# Patient Record
Sex: Female | Born: 1965 | Race: Black or African American | Hispanic: No | Marital: Single | Smoking: Current every day smoker
Health system: Southern US, Community
[De-identification: ages and names within clinical notes are randomized; demographics above are authoritative.]

## PROBLEM LIST (undated history)

## (undated) ENCOUNTER — Emergency Department (HOSPITAL_COMMUNITY): Payer: Medicaid Other

## (undated) DIAGNOSIS — M1990 Unspecified osteoarthritis, unspecified site: Secondary | ICD-10-CM

## (undated) DIAGNOSIS — N200 Calculus of kidney: Secondary | ICD-10-CM

---

## 2015-05-17 ENCOUNTER — Other Ambulatory Visit: Payer: Self-pay | Admitting: Internal Medicine

## 2015-05-17 DIAGNOSIS — E2839 Other primary ovarian failure: Secondary | ICD-10-CM

## 2015-05-28 ENCOUNTER — Other Ambulatory Visit: Payer: Self-pay | Admitting: Internal Medicine

## 2015-05-28 ENCOUNTER — Ambulatory Visit
Admission: RE | Admit: 2015-05-28 | Discharge: 2015-05-28 | Disposition: A | Discharge status: Home / Self Care | Payer: Medicaid Other | Source: Ambulatory Visit | Attending: Internal Medicine | Admitting: Internal Medicine

## 2015-05-28 DIAGNOSIS — E2839 Other primary ovarian failure: Secondary | ICD-10-CM

## 2015-05-28 DIAGNOSIS — Z1231 Encounter for screening mammogram for malignant neoplasm of breast: Secondary | ICD-10-CM

## 2015-06-08 ENCOUNTER — Encounter: Payer: Medicaid Other | Admitting: Family Medicine

## 2015-06-29 ENCOUNTER — Encounter: Payer: Self-pay | Admitting: Obstetrics & Gynecology

## 2015-07-18 ENCOUNTER — Encounter: Payer: Self-pay | Admitting: Obstetrics & Gynecology

## 2015-08-10 ENCOUNTER — Encounter: Payer: Self-pay | Admitting: Family Medicine

## 2016-10-30 ENCOUNTER — Ambulatory Visit (HOSPITAL_COMMUNITY)
Admission: EM | Admit: 2016-10-30 | Discharge: 2016-10-30 | Disposition: A | Discharge status: Home / Self Care | Payer: Medicaid Other | Attending: Emergency Medicine | Admitting: Emergency Medicine

## 2016-10-30 ENCOUNTER — Encounter (HOSPITAL_COMMUNITY): Payer: Self-pay | Admitting: Emergency Medicine

## 2016-10-30 ENCOUNTER — Ambulatory Visit (INDEPENDENT_AMBULATORY_CARE_PROVIDER_SITE_OTHER): Payer: Medicaid Other

## 2016-10-30 DIAGNOSIS — M1711 Unilateral primary osteoarthritis, right knee: Secondary | ICD-10-CM

## 2016-10-30 MED ORDER — TRIAMCINOLONE ACETONIDE 40 MG/ML IJ SUSP
INTRAMUSCULAR | Status: AC
  Filled 2016-10-30: qty 1

## 2016-10-30 MED ORDER — LIDOCAINE HCL (PF) 2 % IJ SOLN
INTRAMUSCULAR | Status: AC
  Filled 2016-10-30: qty 2

## 2016-10-30 NOTE — ED Triage Notes (Signed)
Here for right knee pain and swelling onset 1 week  States this occurred during the cold weather  Hx of right knee surgery... Denies hardware  Pain increases w/activity   A&O x4... NAD

## 2016-10-30 NOTE — Discharge Instructions (Signed)
You have some arthritis in your knee that was flared up by the cold. We did a cortisone injection today. Ice your knee tonight and tomorrow. Take ibuprofen or Tylenol as needed for pain. The cortisone takes 24-48 hours to kick in. Follow-up as needed.

## 2016-10-30 NOTE — ED Provider Notes (Signed)
[facility]    CSN: [id number] Arrival date & time: 10/30/16  1809     History   Chief Complaint Chief Complaint  Patient presents with  . Knee Pain    HPI Krista Romero is a 51 y.o. female.   HPI  She is a 51 year old woman here for evaluation of right knee pain and swelling. Symptoms started about a week ago when we had the cold snap. She states she had an anterior cruciate ligament repair in that knee when she was 17. She denies any problems with the knee until the last week or so. Pain is located primarily at the anterior and medial aspects of the knee. It is worse with weightbearing and going upstairs. She states it feels like it will slip out on her. No known injury or trauma.  History reviewed. No pertinent past medical history.  There are no active problems to display for this patient.   History reviewed. No pertinent surgical history.  OB History    No data available       Home Medications    Prior to Admission medications   Not on File    Family History No family history on file.  Social History Social History  Substance Use Topics  . Smoking status: Current Every Day Smoker    Packs/day: 0.50    Types: Cigarettes  . Smokeless tobacco: Never Used  . Alcohol use Yes     Allergies   Patient has no known allergies.   Review of Systems Review of Systems As in history of present illness  Physical Exam Triage Vital Signs ED Triage Vitals  Enc Vitals Group     BP 10/30/16 1918 143/80     Pulse Rate 10/30/16 1918 89     Resp 10/30/16 1918 16     Temp 10/30/16 1918 98.1 F (36.7 C)     Temp Source 10/30/16 1918 Oral     SpO2 10/30/16 1918 100 %     Weight --      Height --      Head Circumference --      Peak Flow --      Pain Score 10/30/16 1919 7     Pain Loc --      Pain Edu? --      Excl. in GC? --    No data found.   Updated Vital Signs BP 143/80 (BP Location: Left Arm)   Pulse 89   Temp 98.1 F (36.7 C) (Oral)    Resp 16   SpO2 100%   Visual Acuity Right Eye Distance:   Left Eye Distance:   Bilateral Distance:    Right Eye Near:   Left Eye Near:    Bilateral Near:     Physical Exam  Constitutional: She is oriented to person, place, and time. She appears well-developed and well-nourished. No distress.  Cardiovascular: Normal rate.   Pulmonary/Chest: Effort normal.  Musculoskeletal:  Right knee: No erythema. She does have some soft tissue swelling just medial to the patellar tendon. No appreciable joint effusion. No point tenderness. Negative McMurray's. No joint laxity.  Neurological: She is alert and oriented to person, place, and time.     UC Treatments / Results  Labs (all labs ordered are listed, but only abnormal results are displayed) Labs Reviewed - No data to display  EKG  EKG Interpretation None       Radiology Dg Knee Complete 4 Views Right  Result Date: 10/30/2016 CLINICAL DATA:  Pain swelling and difficulty bearing weight on the right knee. EXAM: RIGHT KNEE - COMPLETE 4+ VIEW COMPARISON:  None. FINDINGS: No evidence of fracture, or dislocation. There is a small to moderate suprapatellar joint effusion. Mild osteoarthritic changes of all 3 compartments of the knee noted. Intra-articular calcifications are seen within the lateral compartment of the knee joint. IMPRESSION: Small to moderate suprapatellar joint effusion. Mild 3 compartment osteoarthritic changes. Electronically Signed   By: Ted Mcalpineobrinka  Dimitrova M.D.   On: 10/30/2016 20:21    Procedures Injection of joint Date/Time: 10/30/2016 9:04 PM Performed by: Charm RingsHONIG, Deakin Lacek J Authorized by: Charm RingsHONIG, Ulani Degrasse J  Consent: Verbal consent obtained. Consent given by: patient Patient identity confirmed: verbally with patient Comments: Skin was prepped with alcohol. Cold spray was used as anesthetic. 40 mg Kenalog was injected with 4 mL of 2% lidocaine. Patient tolerated procedure well with no immediate complication.     (including critical care time)  Medications Ordered in UC Medications - No data to display   Initial Impression / Assessment and Plan / UC Course  I have reviewed the triage vital signs and the nursing notes.  Pertinent labs & imaging results that were available during my care of the patient were reviewed by me and considered in my medical decision making (see chart for details).  Clinical Course     Cortisone injection done today. Follow-up as needed.  Final Clinical Impressions(s) / UC Diagnoses   Final diagnoses:  Arthritis of right knee    New Prescriptions There are no discharge medications for this patient.    Charm RingsErin J Sabrina Arriaga, MD 10/30/16 2105

## 2017-04-04 ENCOUNTER — Ambulatory Visit (HOSPITAL_COMMUNITY)
Admission: EM | Admit: 2017-04-04 | Discharge: 2017-04-04 | Disposition: A | Discharge status: Home / Self Care | Payer: Medicaid Other | Attending: Family Medicine | Admitting: Family Medicine

## 2017-04-04 ENCOUNTER — Encounter (HOSPITAL_COMMUNITY): Payer: Self-pay | Admitting: Emergency Medicine

## 2017-04-04 DIAGNOSIS — R319 Hematuria, unspecified: Secondary | ICD-10-CM | POA: Diagnosis not present

## 2017-04-04 DIAGNOSIS — R109 Unspecified abdominal pain: Secondary | ICD-10-CM

## 2017-04-04 LAB — POCT URINALYSIS DIP (DEVICE)
Bilirubin Urine: NEGATIVE
Glucose, UA: NEGATIVE mg/dL
KETONES UR: NEGATIVE mg/dL
LEUKOCYTES UA: NEGATIVE
Nitrite: NEGATIVE
Protein, ur: NEGATIVE mg/dL
UROBILINOGEN UA: 0.2 mg/dL (ref 0.0–1.0)
pH: 5 (ref 5.0–8.0)

## 2017-04-04 LAB — POCT I-STAT, CHEM 8
BUN: 12 mg/dL (ref 6–20)
CHLORIDE: 105 mmol/L (ref 101–111)
CREATININE: 1.1 mg/dL — AB (ref 0.44–1.00)
Calcium, Ion: 1.23 mmol/L (ref 1.15–1.40)
GLUCOSE: 91 mg/dL (ref 65–99)
HCT: 42 % (ref 36.0–46.0)
HEMOGLOBIN: 14.3 g/dL (ref 12.0–15.0)
POTASSIUM: 4.2 mmol/L (ref 3.5–5.1)
Sodium: 140 mmol/L (ref 135–145)
TCO2: 25 mmol/L (ref 0–100)

## 2017-04-04 MED ORDER — HYDROCODONE-ACETAMINOPHEN 5-325 MG PO TABS: 1.0000 | ORAL_TABLET | ORAL | 0 refills | Status: DC | PRN

## 2017-04-04 MED ORDER — KETOROLAC TROMETHAMINE 60 MG/2ML IM SOLN
60.0000 mg | Freq: Once | INTRAMUSCULAR | Status: AC
  Administered 2017-04-04: 60 mg via INTRAMUSCULAR

## 2017-04-04 MED ORDER — TAMSULOSIN HCL 0.4 MG PO CAPS: 0.4000 mg | ORAL_CAPSULE | Freq: Every day | ORAL | 0 refills | Status: AC

## 2017-04-04 MED ORDER — KETOROLAC TROMETHAMINE 60 MG/2ML IM SOLN
INTRAMUSCULAR | Status: AC
  Filled 2017-04-04: qty 2

## 2017-04-04 NOTE — ED Provider Notes (Signed)
CSN: [id number]     Arrival date & time 04/04/17  1643 History   First MD Initiated Contact with Patient 04/04/17 1742     Chief Complaint  Patient presents with  . Back Pain   (Consider location/radiation/quality/duration/timing/severity/associated sxs/prior Treatment) Patient is a healthy 51 y.o. Female, started experiencing right flank pain 9/10 five days ago. She went to see a family physician 2 days ago and felt like the doctor rushed through her appointment and she was told that she had hematuria and was given Cipro and Norco to take and return in 2 weeks for f/u.   Today she is not any better, she states that her right flank pain is now 7/10 improving but she is now having new onset of left flank pain at 9/10. Pain does not radiate.  She did have a history of renal stone a long time ago. She denies dysuria. She does have urinary frequency but have been a lot of water. She denies fever. She denies nausea. She reports any type of pressure on back makes pain worse. She is having hard time sleeping and laying down.       History reviewed. No pertinent past medical history. History reviewed. No pertinent surgical history. No family history on file. Social History  Substance Use Topics  . Smoking status: Current Every Day Smoker    Packs/day: 0.50    Types: Cigarettes  . Smokeless tobacco: Never Used  . Alcohol use Yes   OB History    No data available     Review of Systems  Constitutional:       See HPI    Allergies  Cheese  Home Medications   Prior to Admission medications   Medication Sig Start Date End Date Taking? Authorizing Provider  ciprofloxacin (CIPRO) 500 MG tablet Take 500 mg by mouth 2 (two) times daily.   Yes [provider]  HYDROcodone-acetaminophen (NORCO/VICODIN) 5-325 MG tablet Take 1 tablet by mouth every 4 (four) hours as needed. 04/04/17   Lucia EstelleZheng, Ramonte Mena, NP  tamsulosin (FLOMAX) 0.4 MG CAPS capsule Take 1 capsule (0.4 mg total) by mouth daily.  04/04/17 04/11/17  Lucia EstelleZheng, Remo Kirschenmann, NP   Meds Ordered and Administered this Visit   Medications  ketorolac (TORADOL) injection 60 mg (60 mg Intramuscular Given 04/04/17 1808)    BP 140/76 (BP Location: Right Arm)   Pulse 78   Temp 98.2 F (36.8 C) (Oral)   Resp 16   SpO2 100%  No data found.   Physical Exam  Constitutional: She is oriented to person, place, and time. She appears well-developed and well-nourished.  Cardiovascular: Normal rate, regular rhythm and normal heart sounds.   Pulmonary/Chest: Effort normal and breath sounds normal.  Abdominal: Soft. Bowel sounds are normal. There is no tenderness.  Genitourinary:  Genitourinary Comments: Positive bilateral CVA tenderness  Neurological: She is alert and oriented to person, place, and time.  Skin: Skin is warm and dry.  Nursing note and vitals reviewed.   Urgent Care Course     Procedures (including critical care time)  Labs Review Labs Reviewed  POCT URINALYSIS DIP (DEVICE) - Abnormal; Notable for the following:       Result Value   Hgb urine dipstick SMALL (*)    All other components within normal limits  POCT I-STAT, CHEM 8 - Abnormal; Notable for the following:    Creatinine, Ser 1.10 (*)    All other components within normal limits    Imaging Review No results found.  16:10: UA has presence of hematuria. Highly suspect Renal stone. Will obtain IStat to check Creatine level. Patient declines ER visit; prefers to be treated outpatient first; states that she could go back to the ER later.   MDM   1. Flank pain   2. Hematuria, unspecified type    51 y.o. Healthy female with hx of kidney stone, presents today for flank pain with hematuria in UA, not improving on CIPRO x 2 days now. Highly suspect nephrolithiasis.  Educated on what we can do outpatient and what ER can offer (CT scan). Educated on the advantages of the ER visit.   Patient declines ER visit. She rather to be treated outpatient first. Creatine  level is appropriate. Toradol 60mg  IM given. Send home with Norco refill ([place] registry reviewed), Flomax and a urinary strainer.   Contact info for an urology given; advised to f/u with urologist next week.   Educational handout given.      Lucia Estelle, NP 04/04/17 Paulo Fruit

## 2017-04-04 NOTE — Discharge Instructions (Signed)
Call Monday to see if you can schedule an appointment with the urology office.

## 2017-04-04 NOTE — ED Triage Notes (Signed)
Pt c/o back pain onset 5 days  Saw PCP x2 days ago and was given Cipro 500mg , Vicodin 5-325mg , Meloxicam 15mg  w/some relief  Pain has worsened today... Sx also include urinary freq/urgency, abd bloating   Pain increases w/activity and breathing  Denies fevers, n/v, hematuria   A&O x4... NAD... Slow gait

## 2017-08-11 ENCOUNTER — Ambulatory Visit (HOSPITAL_COMMUNITY)
Admission: EM | Admit: 2017-08-11 | Discharge: 2017-08-11 | Disposition: A | Discharge status: Home / Self Care | Payer: Medicaid Other | Attending: Physician Assistant | Admitting: Physician Assistant

## 2017-08-11 ENCOUNTER — Encounter (HOSPITAL_COMMUNITY): Payer: Self-pay | Admitting: Emergency Medicine

## 2017-08-11 DIAGNOSIS — R42 Dizziness and giddiness: Secondary | ICD-10-CM

## 2017-08-11 DIAGNOSIS — R110 Nausea: Secondary | ICD-10-CM | POA: Diagnosis not present

## 2017-08-11 HISTORY — DX: Unspecified osteoarthritis, unspecified site: M19.90

## 2017-08-11 LAB — POCT I-STAT, CHEM 8
BUN: 11 mg/dL (ref 6–20)
CHLORIDE: 105 mmol/L (ref 101–111)
CREATININE: 0.9 mg/dL (ref 0.44–1.00)
Calcium, Ion: 1.23 mmol/L (ref 1.15–1.40)
Glucose, Bld: 92 mg/dL (ref 65–99)
HEMATOCRIT: 45 % (ref 36.0–46.0)
Hemoglobin: 15.3 g/dL — ABNORMAL HIGH (ref 12.0–15.0)
POTASSIUM: 4.2 mmol/L (ref 3.5–5.1)
Sodium: 139 mmol/L (ref 135–145)
TCO2: 24 mmol/L (ref 22–32)

## 2017-08-11 MED ORDER — MECLIZINE HCL 25 MG PO TABS: 25.0000 mg | ORAL_TABLET | Freq: Three times a day (TID) | ORAL | 0 refills | Status: AC | PRN

## 2017-08-11 NOTE — ED Triage Notes (Signed)
Pt states she woke up this morning and the whole room was spinning. Pt is going through menopause right now, also having hot flashes. Pt states when shes sitting everything looks a little fuzzy, but when she stands everything spins around.

## 2017-08-11 NOTE — ED Provider Notes (Signed)
[facility]    CSN: [id number] Arrival date & time: 08/11/17  1413     History   Chief Complaint Chief Complaint  Patient presents with  . Dizziness    HPI Krista Romero is a 51 y.o. female.   51 year old female with history of arthritis comes in for 1 day history of dizziness. She states she woke up this morning and the whole room was spinning. She feels the dizziness most when standing up, and decreases with sitting down. She has also had some nausea with the dizziness, which she took zofran with relief. She states she has had hot flashes going th menopause, otherwise no fever, chills. She started a diet yesterday, but had a good breakfast with sausage sandwich/lemon tea. She had some nuts/chips and italian soup with garlic toast for the rest of the day. She had oatmeal this morning without improvement. She states she currently has frontal headache without photophobia, phonophobia. Denies chest pain, shortness of breath, syncope, weakness, seizures. Denies URI symptoms such as cough, congestion, sore throat. She had gestational diabetes, denies HTN, HLD, CAD. Denies family history of DM, CAD.       Past Medical History:  Diagnosis Date  . Arthritis     There are no active problems to display for this patient.   History reviewed. No pertinent surgical history.  OB History    No data available       Home Medications    Prior to Admission medications   Medication Sig Start Date End Date Taking? Authorizing Provider  meclizine (ANTIVERT) 25 MG tablet Take 1 tablet (25 mg total) by mouth 3 (three) times daily as needed for dizziness. 08/11/17   Belinda FisherYu, Amy V, PA-C    Family History No family history on file.  Social History Social History  Substance Use Topics  . Smoking status: Current Every Day Smoker    Packs/day: 0.50    Types: Cigarettes  . Smokeless tobacco: Never Used  . Alcohol use Yes     Allergies   Cheese   Review of Systems Review of  Systems  Reason unable to perform ROS: See HPI as above.     Physical Exam Triage Vital Signs ED Triage Vitals [08/11/17 1431]  Enc Vitals Group     BP 135/84     Pulse Rate 71     Resp 16     Temp 98.2 F (36.8 C)     Temp Source Oral     SpO2 100 %     Weight      Height      Head Circumference      Peak Flow      Pain Score      Pain Loc      Pain Edu?      Excl. in GC?    Orthostatic VS for the past 24 hrs:  BP- Lying Pulse- Lying BP- Sitting Pulse- Sitting BP- Standing at 0 minutes Pulse- Standing at 0 minutes  08/11/17 1620 132/85 60 123/86 60 122/61 60    Updated Vital Signs BP 135/84   Pulse 71   Temp 98.2 F (36.8 C) (Oral)   Resp 16   SpO2 100%   Physical Exam  Constitutional: She is oriented to person, place, and time. She appears well-developed and well-nourished. No distress.  HENT:  Head: Normocephalic and atraumatic.  Right Ear: Tympanic membrane, external ear and ear canal normal. Tympanic membrane is not erythematous and not bulging.  Left Ear: Tympanic membrane, external ear and ear canal normal. Tympanic membrane is not erythematous and not bulging.  Nose: Nose normal. Right sinus exhibits no maxillary sinus tenderness and no frontal sinus tenderness. Left sinus exhibits no maxillary sinus tenderness and no frontal sinus tenderness.  Mouth/Throat: Uvula is midline, oropharynx is clear and moist and mucous membranes are normal.  Eyes: Pupils are equal, round, and reactive to light. Conjunctivae and EOM are normal.  Neck: Normal range of motion. Neck supple.  Cardiovascular: Normal rate, regular rhythm and normal heart sounds.  Exam reveals no gallop and no friction rub.   No murmur heard. Pulmonary/Chest: Effort normal and breath sounds normal. She has no decreased breath sounds. She has no wheezes. She has no rhonchi. She has no rales.  Lymphadenopathy:    She has no cervical adenopathy.  Neurological: She is alert and oriented to person, place,  and time. She has normal strength. She is not disoriented. No cranial nerve deficit or sensory deficit. She displays a negative Romberg sign. GCS eye subscore is 4. GCS verbal subscore is 5. GCS motor subscore is 6.  Skin: Skin is warm and dry.  Psychiatric: She has a normal mood and affect. Her behavior is normal. Judgment normal.     UC Treatments / Results  Labs (all labs ordered are listed, but only abnormal results are displayed) Labs Reviewed  POCT I-STAT, CHEM 8 - Abnormal; Notable for the following:       Result Value   Hemoglobin 15.3 (*)    All other components within normal limits    EKG  EKG Interpretation None       Radiology No results found.  Procedures Procedures (including critical care time)  Medications Ordered in UC Medications - No data to display   Initial Impression / Assessment and Plan / UC Course  I have reviewed the triage vital signs and the nursing notes.  Pertinent labs & imaging results that were available during my care of the patient were reviewed by me and considered in my medical decision making (see chart for details).    No alarming signs on exam. EKG shows NSR, 70 bpm, no acute changes. Istat without electrolyte abnormality/anemia. Negative orthostatics. Discussed possible BPPV causing symptoms. Start meclizine as directed. Push fluids. Follow up with PCP/ENT if symptoms do not improve. Return precautions given.   Final Clinical Impressions(s) / UC Diagnoses   Final diagnoses:  Dizziness    New Prescriptions Discharge Medication List as of 08/11/2017  4:49 PM    START taking these medications   Details  meclizine (ANTIVERT) 25 MG tablet Take 1 tablet (25 mg total) by mouth 3 (three) times daily as needed for dizziness., Starting Tue 08/11/2017, Normal          Linward Headland V, PA-C 08/11/17 1659

## 2017-08-11 NOTE — Discharge Instructions (Signed)
No alarming signs on exam. Take meclizine for dizziness as directed. Keep hydrated, your urine should be clear to pale yellow in color. Follow up with PCP/ear nose throat for further work up needed. If experiencing worsening symptoms, nausea/vomiting not controlled by medication, chest pain, shortness of breath, weakness, passing out, go to the emergency department for further evaluation.

## 2018-06-06 ENCOUNTER — Other Ambulatory Visit: Payer: Self-pay

## 2018-06-06 ENCOUNTER — Emergency Department (HOSPITAL_COMMUNITY)
Admission: EM | Admit: 2018-06-06 | Discharge: 2018-06-06 | Disposition: A | Discharge status: Home / Self Care | Payer: Medicaid Other | Attending: Emergency Medicine | Admitting: Emergency Medicine

## 2018-06-06 ENCOUNTER — Emergency Department (HOSPITAL_COMMUNITY): Payer: Medicaid Other

## 2018-06-06 ENCOUNTER — Encounter (HOSPITAL_COMMUNITY): Payer: Self-pay | Admitting: Emergency Medicine

## 2018-06-06 DIAGNOSIS — F17210 Nicotine dependence, cigarettes, uncomplicated: Secondary | ICD-10-CM | POA: Insufficient documentation

## 2018-06-06 DIAGNOSIS — Z79899 Other long term (current) drug therapy: Secondary | ICD-10-CM | POA: Insufficient documentation

## 2018-06-06 DIAGNOSIS — R109 Unspecified abdominal pain: Secondary | ICD-10-CM | POA: Insufficient documentation

## 2018-06-06 DIAGNOSIS — R52 Pain, unspecified: Secondary | ICD-10-CM

## 2018-06-06 HISTORY — DX: Calculus of kidney: N20.0

## 2018-06-06 LAB — CBC WITH DIFFERENTIAL/PLATELET
Abs Immature Granulocytes: 0 10*3/uL (ref 0.0–0.1)
Basophils Absolute: 0 10*3/uL (ref 0.0–0.1)
Basophils Relative: 0 %
EOS ABS: 0.1 10*3/uL (ref 0.0–0.7)
Eosinophils Relative: 1 %
HEMATOCRIT: 40.3 % (ref 36.0–46.0)
Hemoglobin: 12.7 g/dL (ref 12.0–15.0)
IMMATURE GRANULOCYTES: 0 %
LYMPHS ABS: 3.4 10*3/uL (ref 0.7–4.0)
Lymphocytes Relative: 35 %
MCH: 27.2 pg (ref 26.0–34.0)
MCHC: 31.5 g/dL (ref 30.0–36.0)
MCV: 86.3 fL (ref 78.0–100.0)
MONO ABS: 0.4 10*3/uL (ref 0.1–1.0)
MONOS PCT: 4 %
Neutro Abs: 5.9 10*3/uL (ref 1.7–7.7)
Neutrophils Relative %: 60 %
Platelets: 197 10*3/uL (ref 150–400)
RBC: 4.67 MIL/uL (ref 3.87–5.11)
RDW: 15.9 % — AB (ref 11.5–15.5)
WBC: 9.9 10*3/uL (ref 4.0–10.5)

## 2018-06-06 LAB — BASIC METABOLIC PANEL
Anion gap: 8 (ref 5–15)
BUN: 10 mg/dL (ref 6–20)
CHLORIDE: 108 mmol/L (ref 98–111)
CO2: 22 mmol/L (ref 22–32)
CREATININE: 0.86 mg/dL (ref 0.44–1.00)
Calcium: 9.6 mg/dL (ref 8.9–10.3)
GFR calc Af Amer: >60 mL/min (ref >60)
GFR calc non Af Amer: >60 mL/min (ref >60)
GLUCOSE: 100 mg/dL — AB (ref 70–99)
Potassium: 3.7 mmol/L (ref 3.5–5.1)
Sodium: 138 mmol/L (ref 135–145)

## 2018-06-06 LAB — URINALYSIS, ROUTINE W REFLEX MICROSCOPIC
BILIRUBIN URINE: NEGATIVE
Bacteria, UA: NONE SEEN
Glucose, UA: NEGATIVE mg/dL
KETONES UR: NEGATIVE mg/dL
LEUKOCYTES UA: NEGATIVE
Nitrite: NEGATIVE
PH: 6 (ref 5.0–8.0)
Protein, ur: NEGATIVE mg/dL
Specific Gravity, Urine: 1.002 — ABNORMAL LOW (ref 1.005–1.030)

## 2018-06-06 LAB — LIPASE, BLOOD: Lipase: 28 U/L (ref 11–51)

## 2018-06-06 LAB — HEPATIC FUNCTION PANEL
ALT: 20 U/L (ref 0–44)
AST: 16 U/L (ref 15–41)
Albumin: 3.5 g/dL (ref 3.5–5.0)
Alkaline Phosphatase: 93 U/L (ref 38–126)
Bilirubin, Direct: 0.1 mg/dL (ref 0.0–0.2)
Indirect Bilirubin: 0.6 mg/dL (ref 0.3–0.9)
Total Bilirubin: 0.7 mg/dL (ref 0.3–1.2)
Total Protein: 7 g/dL (ref 6.5–8.1)

## 2018-06-06 LAB — PREGNANCY, URINE: Preg Test, Ur: NEGATIVE

## 2018-06-06 MED ORDER — MORPHINE SULFATE (PF) 4 MG/ML IV SOLN
8.0000 mg | Freq: Once | INTRAVENOUS | Status: AC
  Administered 2018-06-06: 8 mg via INTRAVENOUS
  Filled 2018-06-06: qty 2

## 2018-06-06 MED ORDER — ONDANSETRON HCL 4 MG/2ML IJ SOLN: 4.0000 mg | Freq: Once | INTRAMUSCULAR | Status: DC

## 2018-06-06 MED ORDER — RANITIDINE HCL 150 MG PO TABS: 150.0000 mg | ORAL_TABLET | Freq: Two times a day (BID) | ORAL | 0 refills | Status: AC

## 2018-06-06 MED ORDER — ONDANSETRON HCL 4 MG/2ML IJ SOLN
4.0000 mg | Freq: Once | INTRAMUSCULAR | Status: AC
  Administered 2018-06-06: 4 mg via INTRAVENOUS
  Filled 2018-06-06: qty 2

## 2018-06-06 MED ORDER — IBUPROFEN 600 MG PO TABS: 600.0000 mg | ORAL_TABLET | Freq: Four times a day (QID) | ORAL | 0 refills | Status: AC | PRN

## 2018-06-06 MED ORDER — ACETAMINOPHEN 325 MG PO TABS: 650.0000 mg | ORAL_TABLET | Freq: Four times a day (QID) | ORAL | 0 refills | Status: AC | PRN

## 2018-06-06 MED ORDER — KETOROLAC TROMETHAMINE 15 MG/ML IJ SOLN
15.0000 mg | Freq: Once | INTRAMUSCULAR | Status: AC
Start: 2018-06-06 — End: 2018-06-06
  Administered 2018-06-06: 15 mg via INTRAVENOUS
  Filled 2018-06-06: qty 1

## 2018-06-06 MED ORDER — IOPAMIDOL (ISOVUE-300) INJECTION 61%
INTRAVENOUS | Status: AC
  Administered 2018-06-06: 100 mL
  Filled 2018-06-06: qty 100

## 2018-06-06 MED ORDER — NAPROXEN 500 MG PO TABS: 500.0000 mg | ORAL_TABLET | Freq: Two times a day (BID) | ORAL | 0 refills | Status: AC | PRN

## 2018-06-06 NOTE — ED Provider Notes (Addendum)
[facility]   CSN: [id number] Arrival date & time: 06/06/18  0802     History   Chief Complaint Chief Complaint  Patient presents with  . Flank Pain    HPI Krista Romero is a 52 y.o. female.  HPI 52 year old female with history of kidney stones comes in with chief complaint of flank pain.  Patient reports that her flank pain started yesterday and is located on the right side.  Pain has gotten worse overnight and she was unable to sleep well.  Patient has taken over-the-counter medications without significant relief.  Patient has had nausea with vomiting because of the pain, she denies any UTI-like symptoms.  Patient is also now perimenopausal and states that she had spotting 2 weeks ago, but she does not think she is pregnant.  Past Medical History:  Diagnosis Date  . Arthritis   . Kidney stones     There are no active problems to display for this patient.   History reviewed. No pertinent surgical history.   OB History   None      Home Medications    Prior to Admission medications   Medication Sig Start Date End Date Taking? Authorizing Provider  calcium carbonate (TUMS - DOSED IN MG ELEMENTAL CALCIUM) 500 MG chewable tablet Chew 1 tablet by mouth 2 (two) times daily as needed for indigestion or heartburn.   Yes [provider]  Multiple Vitamin (MULTIVITAMIN) tablet Take 1 tablet by mouth daily.   Yes [provider]  acetaminophen (TYLENOL) 325 MG tablet Take 2 tablets (650 mg total) by mouth every 6 (six) hours as needed. 06/06/18   Derwood KaplanNanavati, Kiylee Thoreson, MD  ibuprofen (ADVIL,MOTRIN) 600 MG tablet Take 1 tablet (600 mg total) by mouth every 6 (six) hours as needed. 06/06/18   Derwood KaplanNanavati, Joselle Deeds, MD  meclizine (ANTIVERT) 25 MG tablet Take 1 tablet (25 mg total) by mouth 3 (three) times daily as needed for dizziness. Patient not taking: Reported on 06/06/2018 08/11/17   Belinda FisherYu, Amy V, PA-C  naproxen (NAPROSYN) 500  MG tablet Take 1 tablet (500 mg total) by mouth 2 (two) times daily as needed. 06/06/18   Blane OharaZavitz, Joshua, MD  ranitidine (ZANTAC) 150 MG tablet Take 1 tablet (150 mg total) by mouth 2 (two) times daily. 06/06/18   Derwood KaplanNanavati, Ludger Bones, MD    Family History No family history on file.  Social History Social History   Tobacco Use  . Smoking status: Current Every Day Smoker    Packs/day: 0.50    Types: Cigarettes  . Smokeless tobacco: Never Used  Substance Use Topics  . Alcohol use: Yes  . Drug use: Not on file     Allergies   Cheese   Review of Systems Review of Systems  Constitutional: Positive for activity change.  Gastrointestinal: Positive for nausea and vomiting.  Genitourinary: Positive for flank pain. Negative for dysuria.  Allergic/Immunologic: Negative for immunocompromised state.     Physical Exam Updated Vital Signs BP 120/72   Pulse 60   Temp 98.5 F (36.9 C) (Oral)   Resp 18   Ht 5\' 4"  (1.626 m)   Wt 86.2 kg   SpO2 100%   BMI 32.61 kg/m   Physical Exam  Constitutional: She is oriented to person, place, and time. She appears well-developed.  HENT:  Head: Normocephalic and atraumatic.  Eyes: EOM are normal.  Neck: Normal range of motion. Neck supple.  Cardiovascular: Normal rate.  Pulmonary/Chest: Effort normal.  Abdominal:  Bowel sounds are normal. There is no tenderness.  Genitourinary:  Genitourinary Comments: Right flank tenderness  Neurological: She is alert and oriented to person, place, and time.  Skin: Skin is warm and dry.  Nursing note and vitals reviewed.    ED Treatments / Results  Labs (all labs ordered are listed, but only abnormal results are displayed) Labs Reviewed  URINALYSIS, ROUTINE W REFLEX MICROSCOPIC - Abnormal; Notable for the following components:      Result Value   Color, Urine COLORLESS (*)    Specific Gravity, Urine 1.002 (*)    Hgb urine dipstick MODERATE (*)    All other components within normal limits  BASIC  METABOLIC PANEL - Abnormal; Notable for the following components:   Glucose, Bld 100 (*)    All other components within normal limits  CBC WITH DIFFERENTIAL/PLATELET - Abnormal; Notable for the following components:   RDW 15.9 (*)    All other components within normal limits  PREGNANCY, URINE  HEPATIC FUNCTION PANEL  LIPASE, BLOOD    EKG None  Radiology No results found.  Procedures Procedures (including critical care time)  Medications Ordered in ED Medications  ketorolac (TORADOL) 15 MG/ML injection 15 mg (15 mg Intravenous Given 06/06/18 0958)  morphine 4 MG/ML injection 8 mg (8 mg Intravenous Given 06/06/18 1211)  ondansetron (ZOFRAN) injection 4 mg (4 mg Intravenous Given 06/06/18 1210)  iopamidol (ISOVUE-300) 61 % injection (100 mLs  Contrast Given 06/06/18 1457)     Initial Impression / Assessment and Plan / ED Course  I have reviewed the triage vital signs and the nursing notes.  Pertinent labs & imaging results that were available during my care of the patient were reviewed by me and considered in my medical decision making (see chart for details).  Clinical Course as of Jun 10 2301  Wynelle LinkSun Jun 06, 2018  1152 Ultrasound is negative for hydronephrosis. We will get CBC CMP and lipase. Given that there is right flank tenderness, differential now expands to hepatobiliary process.  If the labs are completely reassuring then we will proceed with a CT abdomen and pelvis with contrast.  US Renal [AN]    Clinical Course User Index [AN] Derwood KaplanNanavati, Tavaughn Silguero, MD    52 year old female comes in with chief complaint of right-sided flank pain.  On exam she is noted to have flank tenderness only.  She does not have any UTI-like symptoms and also her pregnancy test is negative.   Patient has history of renal colic and it is highly likely that her symptoms are because of it.  Ultrasound has been ordered.  If negative we might have to proceed with CT scan.  Based on history alone this does  not appear to be pyelonephritis.  Final Clinical Impressions(s) / ED Diagnoses   Final diagnoses:  Flank pain  Abdominal pain  Pain    ED Discharge Orders         Ordered    ibuprofen (ADVIL,MOTRIN) 600 MG tablet  Every 6 hours PRN     06/06/18 1622    ranitidine (ZANTAC) 150 MG tablet  2 times daily     06/06/18 1622    acetaminophen (TYLENOL) 325 MG tablet  Every 6 hours PRN     06/06/18 1622    naproxen (NAPROSYN) 500 MG tablet  2 times daily PRN     06/06/18 1649           Derwood KaplanNanavati, Aidenn Skellenger, MD 06/06/18 1048    Derwood KaplanNanavati, Keiona Jenison, MD 06/10/18  2302  

## 2018-06-06 NOTE — Discharge Instructions (Addendum)
We saw in the ER for flank pain on the right side. Results from the emergency room work-up did not reveal any kidney stones or appendicitis.   We are not 100% sure what is causing your pain. Please follow-up with a primary care doctor or a gynecologist for further evaluation. Take ibuprofen for pain control.

## 2018-06-06 NOTE — ED Provider Notes (Signed)
Patient's care signed out to follow-up ultrasound imaging.  Patient had recurrent right flank pain.  Fortunately CT scan did not show any acute findings no kidney stone no signs of appendicitis.  Blood work reassuring reviewed.  Vital signs normal on assessment.  Patient awaiting ultrasound however she chose to be discharged and cancel ultrasounds at this time and follow-up with gynecology in case this is a cyst or other related.  Patient is well-appearing at discharge.  Kenton KingfisherJoshua M Caelan Atchley    Alissa Pharr, MD 06/06/18 [phone]

## 2018-06-06 NOTE — ED Notes (Signed)
Lab will add on preg urine. Called 8/18 at [phone]

## 2018-06-06 NOTE — ED Triage Notes (Signed)
Pt. Stated, I have kidney stones and Im sure this is what it is . I started having lright flank pain yesterday.

## 2021-08-02 ENCOUNTER — Other Ambulatory Visit: Payer: Self-pay | Admitting: Family Medicine

## 2021-08-02 DIAGNOSIS — Z1231 Encounter for screening mammogram for malignant neoplasm of breast: Secondary | ICD-10-CM

## 2021-09-25 ENCOUNTER — Ambulatory Visit: Payer: Medicaid Other

## 2022-03-12 ENCOUNTER — Ambulatory Visit: Payer: Medicaid Other

## 2022-03-19 ENCOUNTER — Ambulatory Visit
Admission: RE | Admit: 2022-03-19 | Discharge: 2022-03-19 | Disposition: A | Discharge status: Home / Self Care | Payer: Medicaid Other | Source: Ambulatory Visit | Attending: Family Medicine | Admitting: Family Medicine

## 2022-03-19 DIAGNOSIS — Z1231 Encounter for screening mammogram for malignant neoplasm of breast: Secondary | ICD-10-CM

## 2022-03-24 ENCOUNTER — Other Ambulatory Visit: Payer: Self-pay | Admitting: Family Medicine

## 2022-03-24 DIAGNOSIS — R928 Other abnormal and inconclusive findings on diagnostic imaging of breast: Secondary | ICD-10-CM

## 2022-03-31 ENCOUNTER — Ambulatory Visit
Admission: RE | Admit: 2022-03-31 | Discharge: 2022-03-31 | Disposition: A | Discharge status: Home / Self Care | Payer: Medicaid Other | Source: Ambulatory Visit | Attending: Family Medicine | Admitting: Family Medicine

## 2022-03-31 DIAGNOSIS — R928 Other abnormal and inconclusive findings on diagnostic imaging of breast: Secondary | ICD-10-CM

## 2023-02-08 IMAGING — US US BREAST*L* LIMITED INC AXILLA
1 series · 7 of 7 positions shown · non-contrast
Comparison: Previous exam(s).

CLINICAL DATA: Screening recall for a possible left breast mass.

EXAM:
DIGITAL DIAGNOSTIC UNILATERAL LEFT MAMMOGRAM WITH TOMOSYNTHESIS AND
CAD; ULTRASOUND LEFT BREAST LIMITED
TECHNIQUE: Left digital diagnostic mammography and breast tomosynthesis was
performed. The images were evaluated with computer-aided detection.;
Targeted ultrasound examination of the left breast was performed.

[Series 1: us breast*left* limited inc axilla · 0.05mm/px · 7 of 7 slices shown]
[im 1/7]
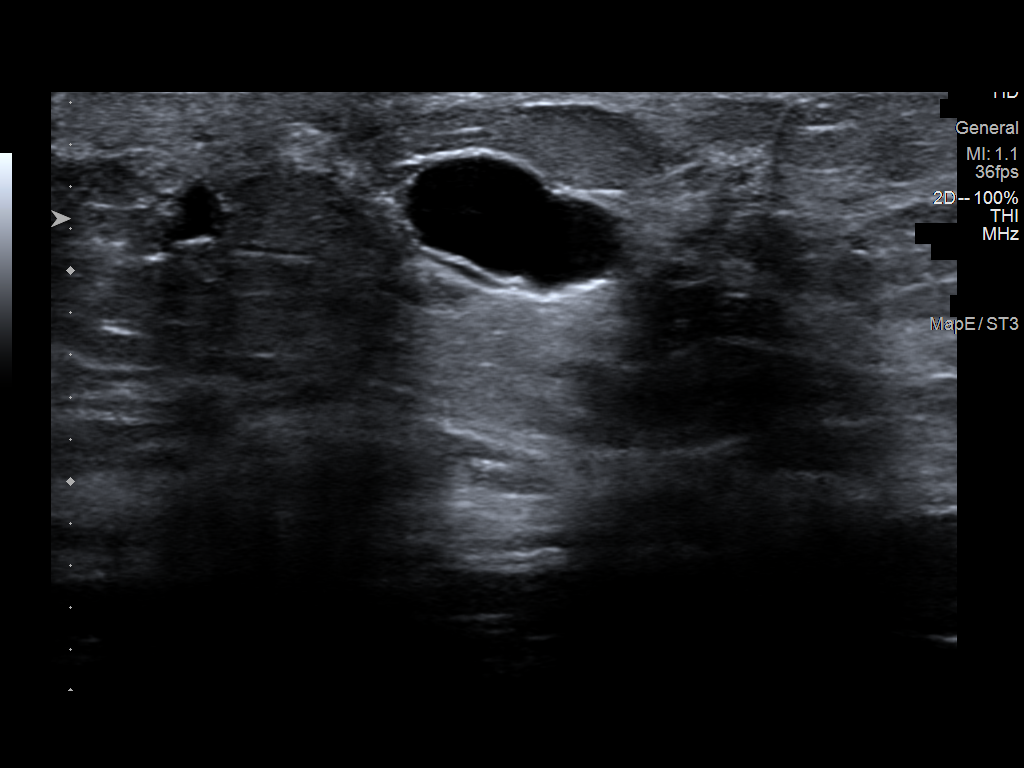
[im 2/7]
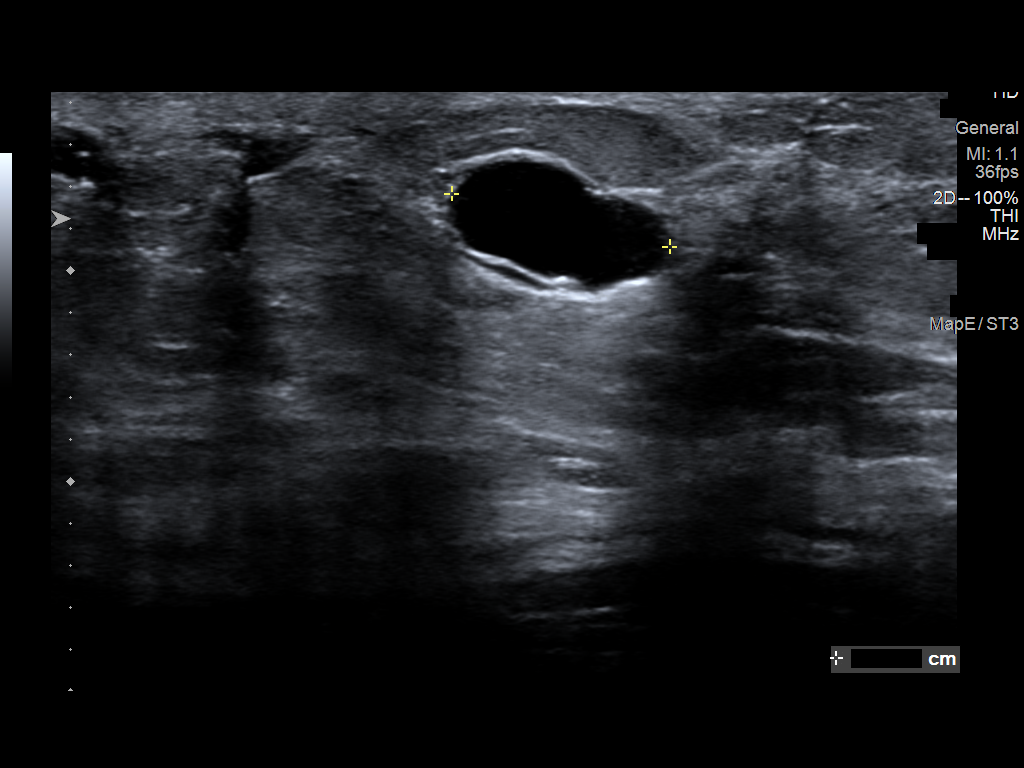
[im 3/7]
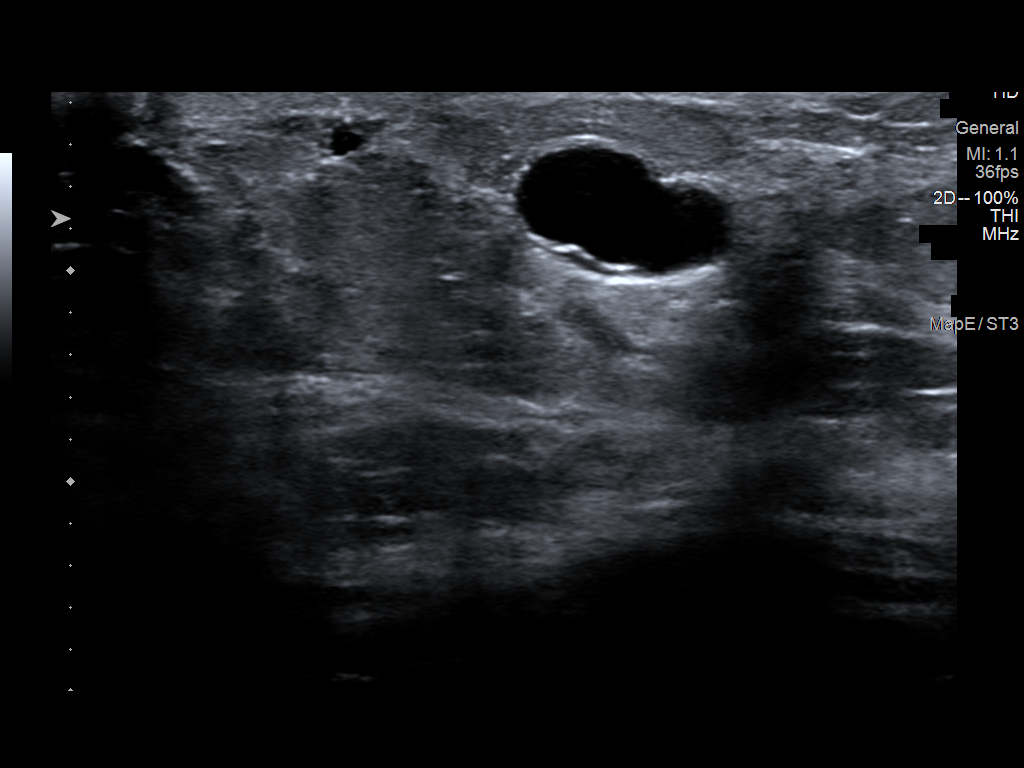
[im 4/7]
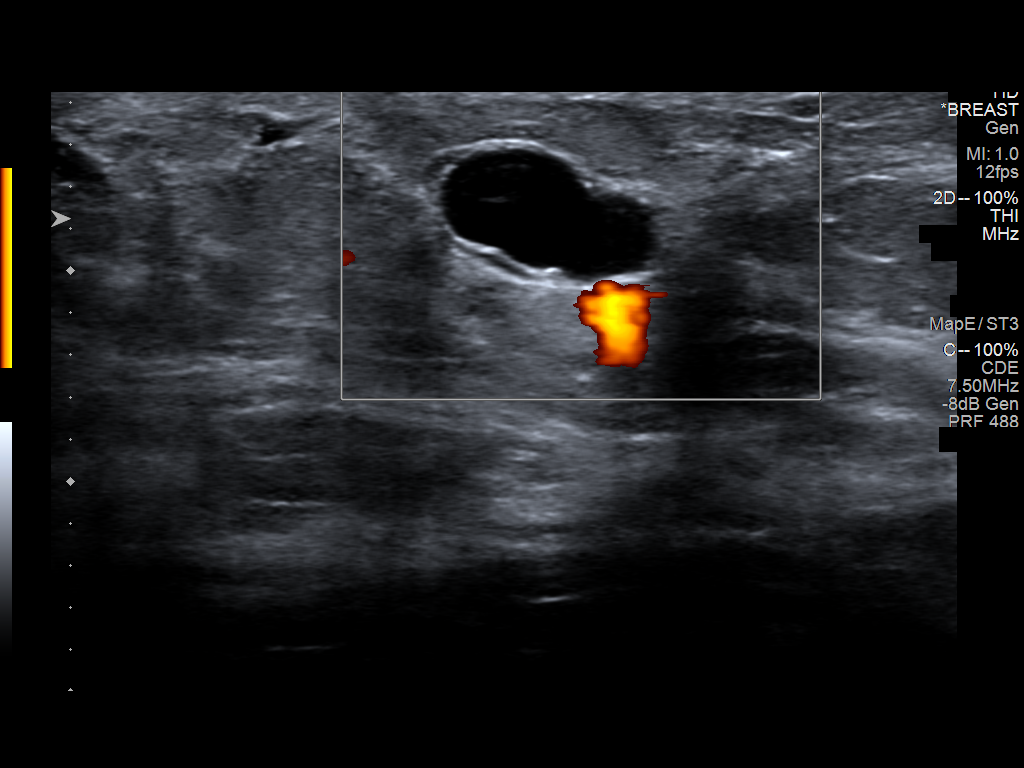
[im 5/7]
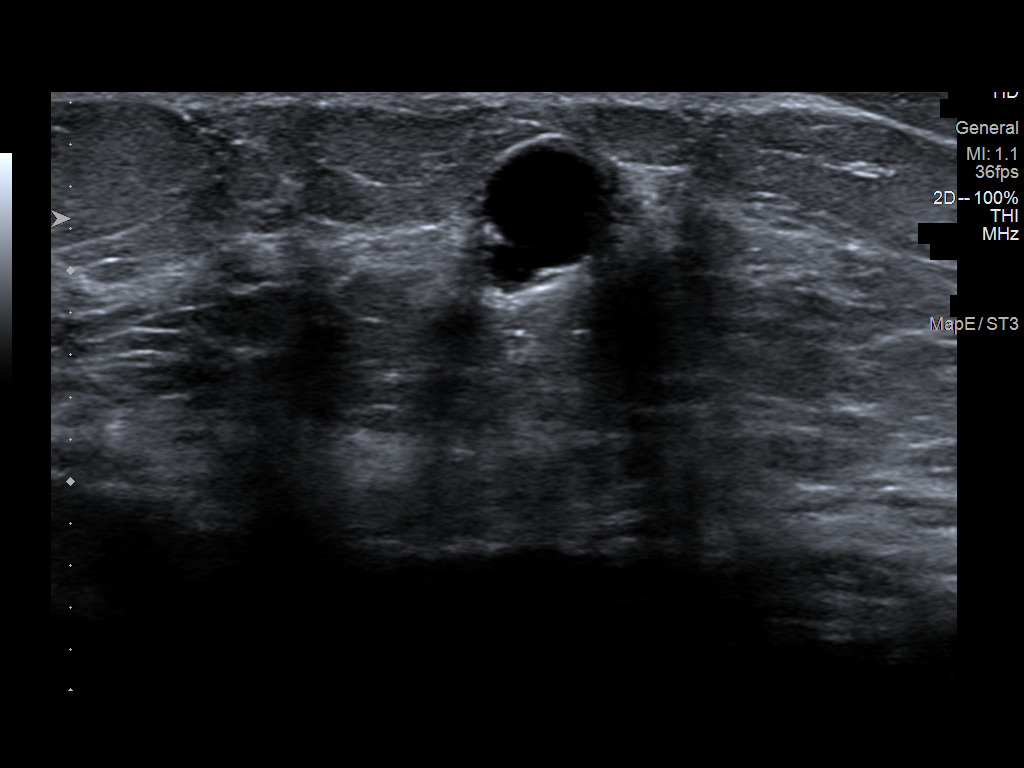
[im 6/7]
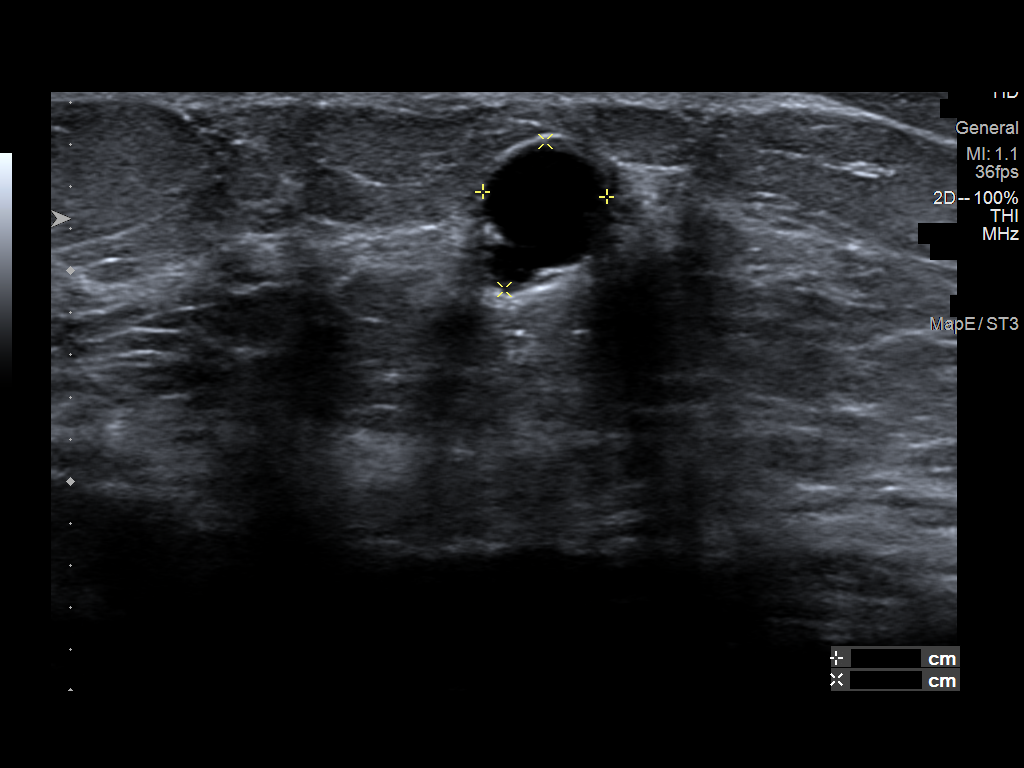
[im 7/7]
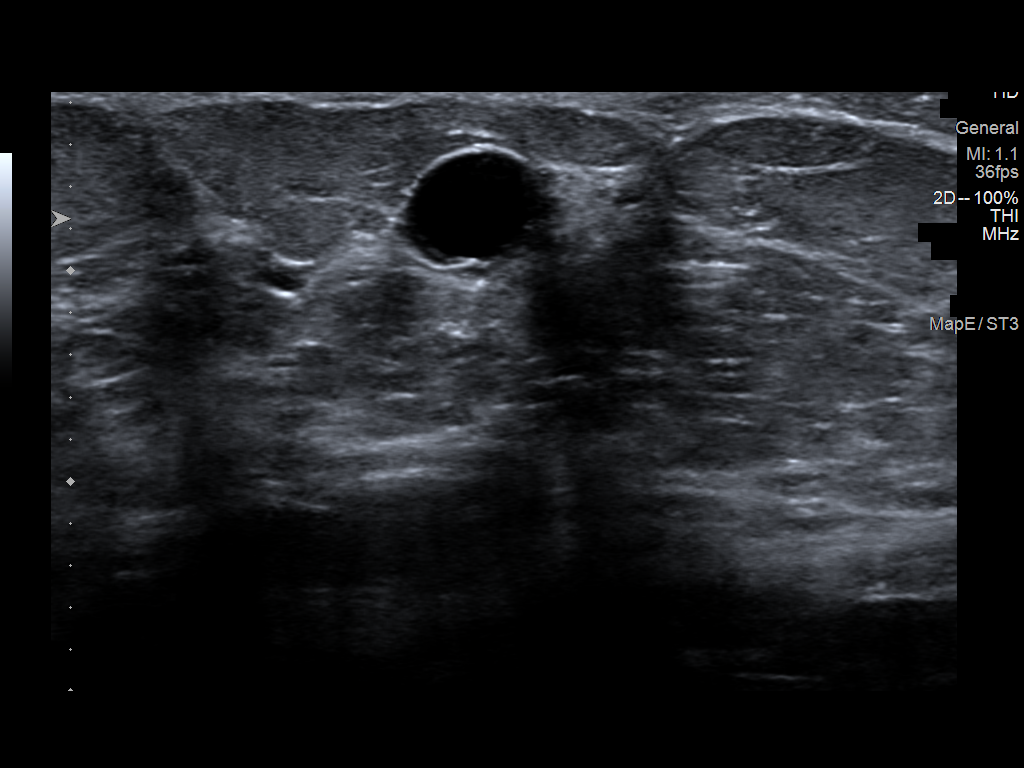

[7 of 7 positions shown; findings below may reference images not displayed]

ACR Breast Density Category c: The breast tissue is heterogeneously
dense, which may obscure small masses.
FINDINGS: On diagnostic imaging, the possible mass persists as an oval,
circumscribed, 1.1 cm mass in the anterolateral left breast.

Targeted ultrasound is performed, showing a simple oval cyst in the
left breast at 2:30 o'clock, 2 cm the nipple, superficial depth,
measuring 1.1 x 0.6 x 0.7 cm, consistent in size, shape and location
to the mammographic mass. No solid masses or suspicious lesions.
IMPRESSION: 1. No evidence of breast malignancy.
2. Benign left breast cyst.

RECOMMENDATION:
Screening mammogram in one year.(Code:36-I-3KF)

I have discussed the findings and recommendations with the patient.
If applicable, a reminder letter will be sent to the patient
regarding the next appointment.

BI-RADS CATEGORY  2: Benign.

## 2023-02-08 IMAGING — MG MM DIGITAL DIAGNOSTIC UNILAT*L* W/ TOMO W/ CAD
4 series · 4 of 12 positions shown · non-contrast
Comparison: Previous exam(s).

CLINICAL DATA: Screening recall for a possible left breast mass.

EXAM:
DIGITAL DIAGNOSTIC UNILATERAL LEFT MAMMOGRAM WITH TOMOSYNTHESIS AND
CAD; ULTRASOUND LEFT BREAST LIMITED
TECHNIQUE: Left digital diagnostic mammography and breast tomosynthesis was
performed. The images were evaluated with computer-aided detection.;
Targeted ultrasound examination of the left breast was performed.

[L CC synth-2D]
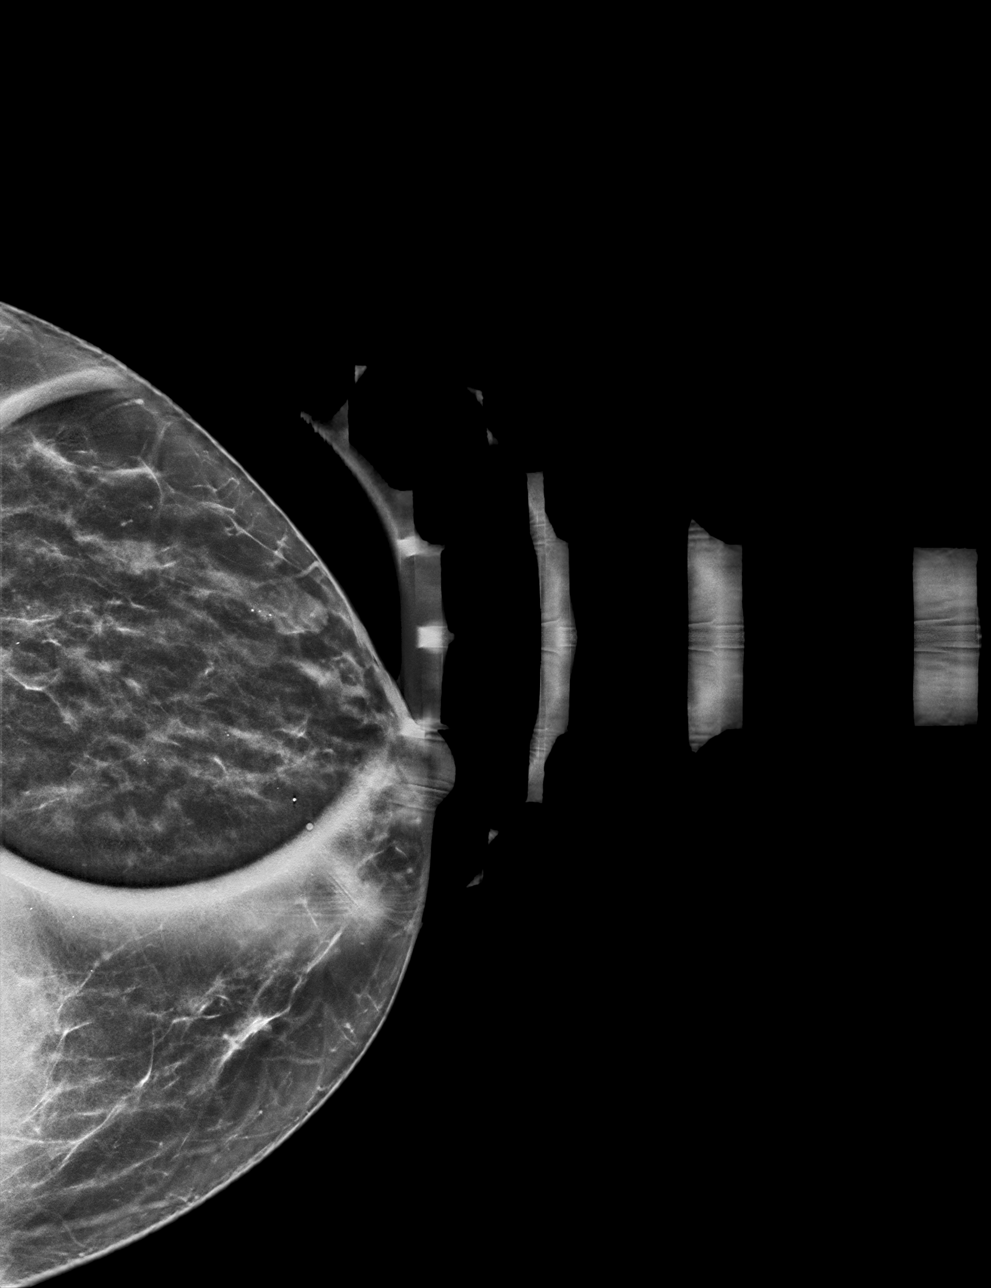

[L MLO synth-2D]
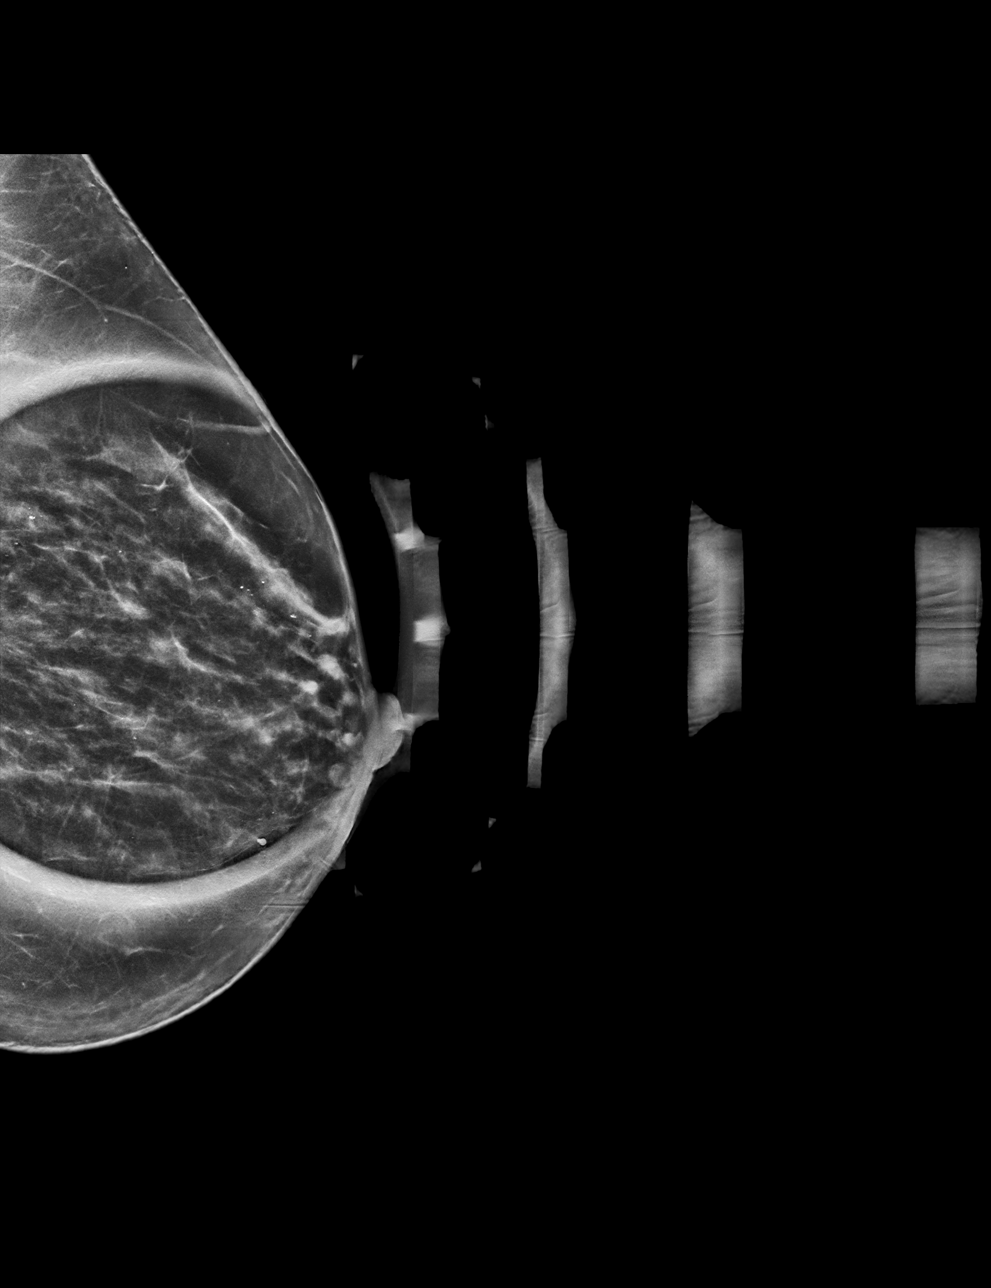

[L MLO tomo · tomo slice 29/57.0]
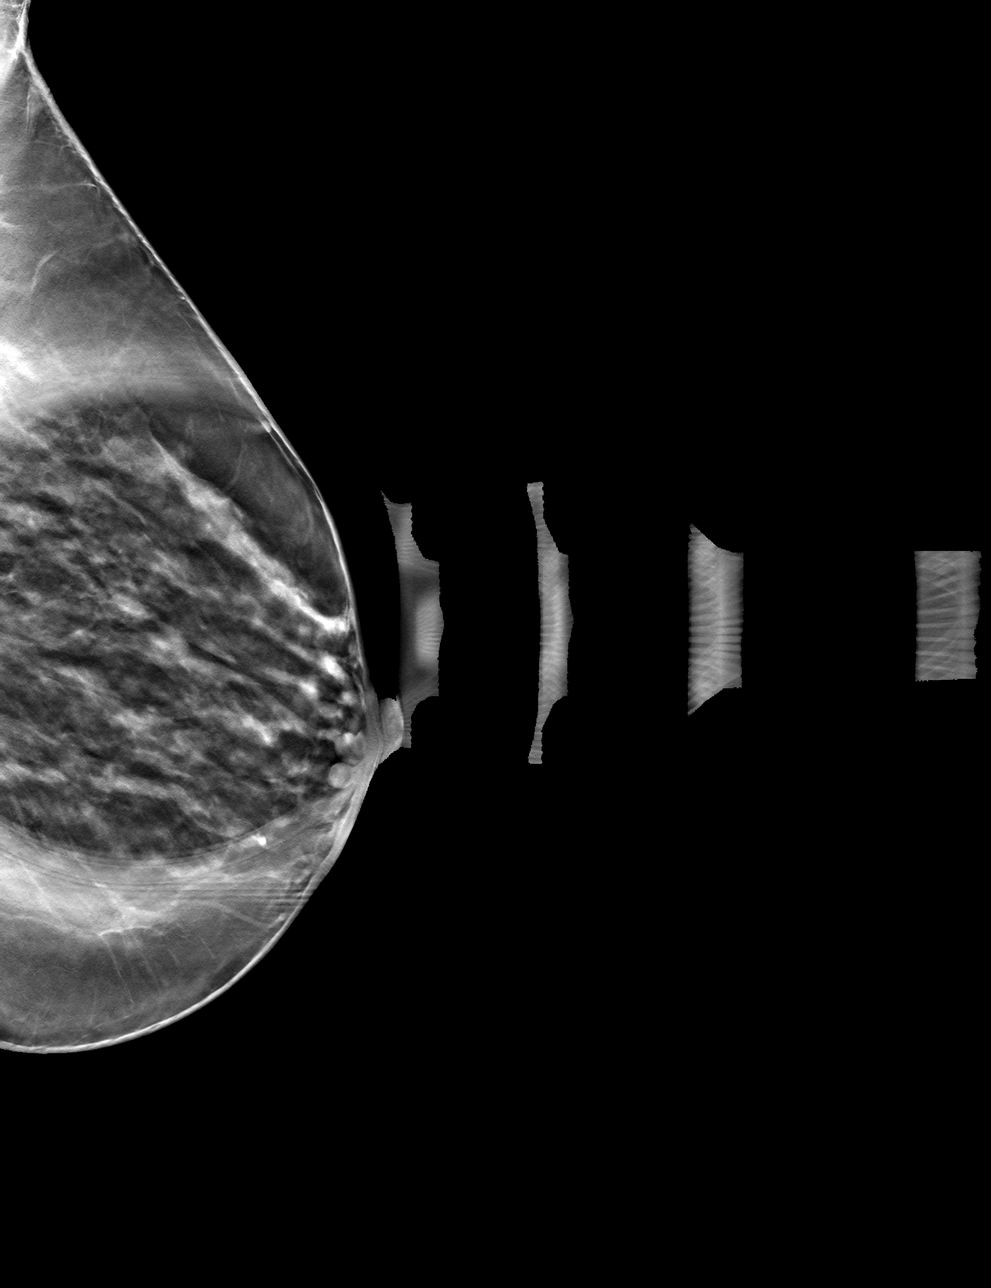

[L CC tomo · tomo slice 28/55.0]
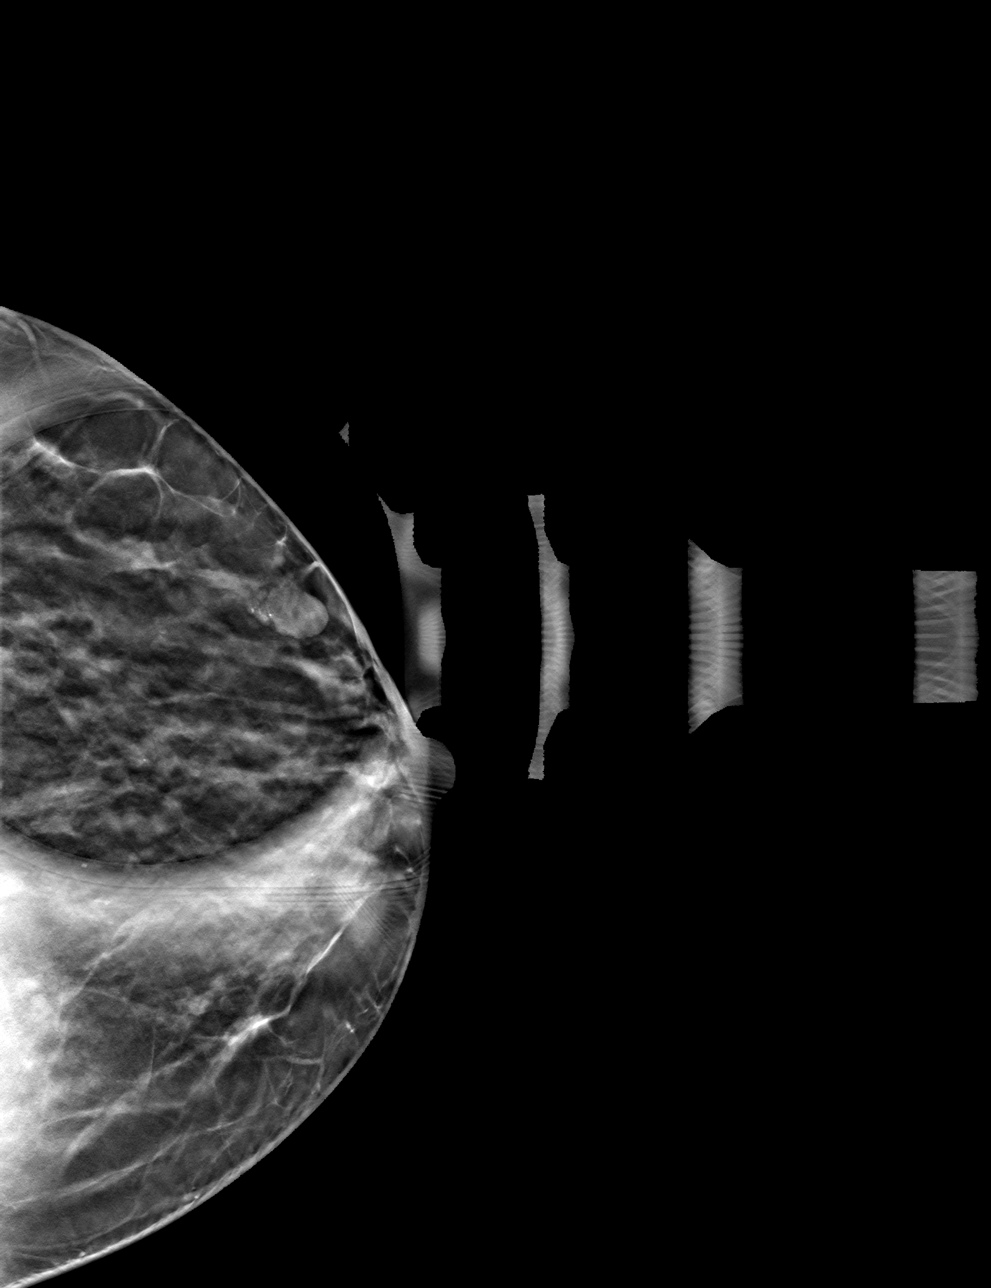

[4 of 12 positions shown; findings below may reference images not displayed]

ACR Breast Density Category c: The breast tissue is heterogeneously
dense, which may obscure small masses.
FINDINGS: On diagnostic imaging, the possible mass persists as an oval,
circumscribed, 1.1 cm mass in the anterolateral left breast.

Targeted ultrasound is performed, showing a simple oval cyst in the
left breast at 2:30 o'clock, 2 cm the nipple, superficial depth,
measuring 1.1 x 0.6 x 0.7 cm, consistent in size, shape and location
to the mammographic mass. No solid masses or suspicious lesions.
IMPRESSION: 1. No evidence of breast malignancy.
2. Benign left breast cyst.

RECOMMENDATION:
Screening mammogram in one year.(Code:[id number]-3KF)

I have discussed the findings and recommendations with the patient.
If applicable, a reminder letter will be sent to the patient
regarding the next appointment.

BI-RADS CATEGORY  2: Benign.

## 2023-08-31 ENCOUNTER — Ambulatory Visit: Payer: Medicaid Other | Attending: Neurological Surgery | Admitting: Physical Therapy

## 2023-08-31 ENCOUNTER — Encounter: Payer: Self-pay | Admitting: Physical Therapy

## 2023-08-31 ENCOUNTER — Other Ambulatory Visit: Payer: Self-pay

## 2023-08-31 DIAGNOSIS — M6281 Muscle weakness (generalized): Secondary | ICD-10-CM | POA: Diagnosis present

## 2023-08-31 DIAGNOSIS — M5459 Other low back pain: Secondary | ICD-10-CM

## 2023-08-31 NOTE — Patient Instructions (Signed)
Access Code: [email or web address] URL: [email or web address] Date: 08/31/2023 Prepared by: Rosana Hoes  Exercises - Supine Posterior Pelvic Tilt  - 1 x daily - 2 sets - 10 reps - 5 seconds hold - Bridge  - 1 x daily - 3 sets - 5 reps - Supine Lower Trunk Rotation  - 1 x daily - 2 sets - 10 reps - Clam with Resistance  - 1 x daily - 3 sets - 10 reps

## 2023-08-31 NOTE — Therapy (Signed)
OUTPATIENT PHYSICAL THERAPY EVALUATION   Patient Name: Krista Romero MRN: [id number] DOB:1966-04-17, 57 y.o., female Today's Date: 08/31/2023   END OF SESSION:  PT End of Session - 08/31/23 0925     Visit Number 1    Number of Visits 9    Date for PT Re-Evaluation 10/26/23    Authorization Type [employer]    PT Start Time 0845    PT Stop Time 0930    PT Time Calculation (min) 45 min    Activity Tolerance Patient tolerated treatment well    Behavior During Therapy [facility] for tasks assessed/performed             Past Medical History:  Diagnosis Date   Arthritis    Kidney stones    History reviewed. No pertinent surgical history. There are no problems to display for this patient.   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 No she is having spasms in her back and it sometimes will freeze up like she is in a bear hug and it is just squeezing, she has to lay or sit down to let it relax. The spasms can come any time. The pain is mainly in the lower back but the spasms can happen all through the back. She gets pain with any sitting or standing for long periods. She tries to avoid lifting heavy things and has to be mindful with any bending or lifting because it will aggravate her back. 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  PERTINENT HISTORY:  See PMH above  PAIN:  Are you having pain? Yes:  NPRS scale: 4/10  Worst: "past 10" /  10 Pain location: Lower back Pain description: Tightening Aggravating factors: Sitting or standing extended periods, bending, lifting Relieving factors: Medication, heating pad, patches or icy hot, lying down  PRECAUTIONS: None  RED FLAGS: None   WEIGHT BEARING RESTRICTIONS: No  FALLS:  Has patient fallen in last 6 months? No  OCCUPATION: Home health aid  PLOF: Independent  PATIENT GOALS: Pain relief  NEXT MD VISIT:  Sometime right before Christmas   OBJECTIVE:  Note: Objective measures were completed at Evaluation unless otherwise noted. DIAGNOSTIC FINDINGS:  Per MD note "x-rays which showed a spondylolisthesis at L4-5 "  PATIENT SURVEYS:  FOTO 52% functional status  SCREENING FOR RED FLAGS: Negative  COGNITION: Overall cognitive status: Within functional limits for tasks assessed     SENSATION: WFL  MUSCLE LENGTH: Hamstring grossly WFL  POSTURE:   Rounded shoulder posture, increased lumbar lordosis  PALPATION: Tender to palpation lower lumbar paraspinals  LUMBAR ROM:   AROM eval  Flexion WFL  Extension 75%  Right lateral flexion WFL  Left lateral flexion WFL  Right rotation 75%  Left rotation 75%   (Blank rows = not tested)  LOWER EXTREMITY ROM:      Hip PROM grossly WFL, right knee limitations  LOWER EXTREMITY MMT:    MMT Right eval Left eval  Hip flexion 4- 4  Hip extension 3- 4-  Hip abduction 3- 4-  Hip adduction    Hip internal rotation    Hip external rotation    Knee flexion 4 5  Knee extension 4 5  Ankle dorsiflexion    Ankle plantarflexion    Ankle inversion    Ankle eversion     (Blank rows = not tested)  LUMBAR SPECIAL TESTS:  Lumbar radicular testing negative  FUNCTIONAL TESTS:  90-90 abdominal hold: unable to hold due to weakness of abdominal musculature  GAIT: Assistive device utilized: None Level of assistance: Complete Independence Comments: Antalgic on right   TODAY'S TREATMENT:    [facility] Adult PT  Treatment:                                                DATE: 08/31/2023 Therapeutic Exercise: Supine PPT 5 x 5 sec Bridge x 5 LTR x 5 each Side clamshell with yellow x 10 each  PATIENT EDUCATION:  Education details: Exam findings, POC, HEP, using TPDN for treatment Person educated: Patient Education method: Explanation, Demonstration, Tactile cues, Verbal cues, and Handouts Education comprehension: verbalized understanding, returned demonstration, verbal cues required, tactile cues required, and needs further education  HOME EXERCISE PROGRAM: Access Code: [email or web address]    ASSESSMENT: CLINICAL IMPRESSION: Patient is a 57 y.o. female who was seen today for physical therapy evaluation and treatment for chronic lower back pain. She demonstrates slight limitations in her lumbar mobility, gross strength deficits of the core and hips, and right leg weakness with limitations with right knee motion and pain of the right knee from prior surgery that is likely contributing to her current low back pain and impairment of her functional status as indicated on FOTO.   OBJECTIVE IMPAIRMENTS: decreased activity tolerance, decreased ROM, decreased strength, postural dysfunction, and pain.   ACTIVITY LIMITATIONS: lifting, bending, sitting, standing, and locomotion level  PARTICIPATION LIMITATIONS: meal prep, cleaning, laundry, driving, community activity, and occupation  PERSONAL FACTORS: Fitness, Past/current experiences, and Time since onset of injury/illness/exacerbation are also affecting patient's functional outcome.   REHAB POTENTIAL: Good  CLINICAL DECISION MAKING: Stable/uncomplicated  EVALUATION COMPLEXITY: Low   GOALS: Goals reviewed with patient? Yes  SHORT TERM GOALS: Target date: 09/28/2023  Patient will be I with initial HEP in order to progress with therapy. Baseline: HEP provided at eval Goal status: INITIAL  2.  Patient will report low back pain with activity </= 7/10 in  order  to reduce functional limitations Baseline: 10/10 pain at worst Goal status: INITIAL  3. Patient will demonstrate lumbar AROM grossly WFL in order to improve bending and ability to perform household tasks  Baseline: see limitations above  Goal status: INITIAL  LONG TERM GOALS: Target date: 10/26/2023  Patient will be I with final HEP to maintain progress from PT. Baseline: HEP provided at eval Goal status: INITIAL  2.  Patient will report >/= 58% status on FOTO to indicate improved functional ability. Baseline: 52% functional status Goal status: INITIAL  3.  Patient will demonstrate hip strength >/= 4/5 MMT in order to improve lifting ability and reduce pain with activity.  Baseline: see limitations above Goal status: INITIAL  4.  Patient will be able to maintain 90-90 abdominal hold for 30 seconds in order to indicate improvement in core control and reduce low back pain with activity Baseline: unable to hold Goal status: INITIAL  5. Patient will report low back pain </= 3/10 in order to reduce functional limitations  Baseline: 10/10 pain  Goal status: INITIAL   PLAN: PT FREQUENCY: 1x/week  PT DURATION: 8 weeks  PLANNED INTERVENTIONS: 97164- PT Re-evaluation, 97110-Therapeutic exercises, 97530- Therapeutic activity, 97112- Neuromuscular re-education, 97535- Self Care, 13086- Manual therapy, [id number]- Aquatic Therapy, Patient/Family education, Dry Needling, Joint mobilization, Joint manipulation, Spinal manipulation, Spinal mobilization, Cryotherapy, and Moist heat.  PLAN FOR NEXT SESSION: Review HEP and progress PRN, manual/TPDN for lumbar region, progress core stabilization and hip strengthening, lifting mechanics   Rosana Hoes, PT, DPT, LAT, ATC 08/31/23  1:16 PM Phone: [phone] Fax: [phone]   For all possible CPT codes, reference the Planned Interventions line above.     Check all conditions that are expected to impact treatment: {Conditions expected to  impact treatment:None of these apply   If treatment provided at initial evaluation, no treatment charged due to lack of authorization.

## 2023-09-11 ENCOUNTER — Ambulatory Visit: Payer: Medicaid Other

## 2023-09-11 DIAGNOSIS — M5459 Other low back pain: Secondary | ICD-10-CM | POA: Diagnosis not present

## 2023-09-11 DIAGNOSIS — M6281 Muscle weakness (generalized): Secondary | ICD-10-CM

## 2023-09-11 NOTE — Therapy (Addendum)
 OUTPATIENT PHYSICAL THERAPY TREATMENT  discharge   Patient Name: Krista Romero MRN: [id number] DOB:09-25-1966, 57 y.o., female Today's Date: 09/11/2023   END OF SESSION:  PT End of Session - 09/11/23 0852     Visit Number 2    Number of Visits 9    Date for PT Re-Evaluation 10/26/23    Authorization Type [employer]    PT Start Time [id number]   arrived late   PT Stop Time 0930    PT Time Calculation (min) 38 min    Activity Tolerance Patient tolerated treatment well    Behavior During Therapy [facility] for tasks assessed/performed              Past Medical History:  Diagnosis Date   Arthritis    Kidney stones    History reviewed. No pertinent surgical history. There are no problems to display for this patient.   PCP: None  REFERRING PROVIDER: Arman Bogus, MD  REFERRING DIAG: Spondylolisthesis, lumbar region  Rationale for Evaluation and Treatment: Rehabilitation  THERAPY DIAG:  Other low back pain  Muscle weakness (generalized)  ONSET DATE: Chronic for years   SUBJECTIVE:                                                                                                                                                                                          SUBJECTIVE STATEMENT: Pt presents to PT with reports of continued LBP. Has been compliant with HEP.   PERTINENT HISTORY:  See PMH above  PAIN:  Are you having pain? Yes:  NPRS scale: 5/10  Worst: "past 10" / 10 Pain location: Lower back Pain description: Tightening Aggravating factors: Sitting or standing extended periods, bending, lifting Relieving factors: Medication, heating pad, patches or icy hot, lying down  PRECAUTIONS: None  RED FLAGS: None   WEIGHT BEARING RESTRICTIONS: No  FALLS:  Has patient fallen in last 6 months? No  OCCUPATION: Home health aid  PLOF: Independent  PATIENT GOALS: Pain relief  NEXT MD VISIT: Sometime right before Christmas   OBJECTIVE:  Note:  Objective measures were completed at Evaluation unless otherwise noted. DIAGNOSTIC FINDINGS:  Per MD note "x-rays which showed a spondylolisthesis at L4-5 "  PATIENT SURVEYS:  FOTO 52% functional status  SCREENING FOR RED FLAGS: Negative  COGNITION: Overall cognitive status: Within functional limits for tasks assessed     SENSATION: WFL  MUSCLE LENGTH: Hamstring grossly WFL  POSTURE:   Rounded shoulder posture, increased lumbar lordosis  PALPATION: Tender to palpation lower lumbar paraspinals  LUMBAR ROM:   AROM eval  Flexion WFL  Extension 75%  Right lateral flexion [facility]  Left lateral flexion WFL  Right rotation 75%  Left rotation 75%   (Blank rows = not tested)  LOWER EXTREMITY ROM:      Hip PROM grossly WFL, right knee limitations  LOWER EXTREMITY MMT:    MMT Right eval Left eval  Hip flexion 4- 4  Hip extension 3- 4-  Hip abduction 3- 4-  Hip adduction    Hip internal rotation    Hip external rotation    Knee flexion 4 5  Knee extension 4 5  Ankle dorsiflexion    Ankle plantarflexion    Ankle inversion    Ankle eversion     (Blank rows = not tested)  LUMBAR SPECIAL TESTS:  Lumbar radicular testing negative  FUNCTIONAL TESTS:  90-90 abdominal hold: unable to hold due to weakness of abdominal musculature  GAIT: Assistive device utilized: None Level of assistance: Complete Independence Comments: Antalgic on right   TODAY'S TREATMENT:    [facility] Adult PT Treatment:                                                DATE: 09/11/2023 Therapeutic Exercise: NuStep lvl 5 UE/LE x 3 min while taking subjective Supine PPT 10 x 5 sec Supine PPT with ball 2x10  Supine clamshell 2x15 GTB Supine PPT with alt clam x 10 GTB Supine pilates SLR 2x10 each STS x 10 - no UE support Pallof press 2x10 7# Row 2x10 13# Standing mini squat x 10 - bilat UE support  [facility] Adult PT Treatment:                                                DATE: 08/31/2023 Therapeutic  Exercise: Supine PPT 5 x 5 sec Bridge x 5 LTR x 5 each Side clamshell with yellow x 10 each  PATIENT EDUCATION:  Education details: continue HEP Person educated: Patient Education method: Programmer, multimedia, Demonstration, Tactile cues, Verbal cues, and Handouts Education comprehension: verbalized understanding, returned demonstration, verbal cues required, tactile cues required, and needs further education  HOME EXERCISE PROGRAM: Access Code: [email or web address]    ASSESSMENT: CLINICAL IMPRESSION: Pt was able to complete all prescribed exercises with no adverse effect or increase in pain. Therapy today focused on improving core/proximal hip strength in order to decrease LBP and improve comfort. Pt is progressing as expected with therapy, will continue per POC.   EVAL: Patient is a 57 y.o. female who was seen today for physical therapy evaluation and treatment for chronic lower back pain. She demonstrates slight limitations in her lumbar mobility, gross strength deficits of the core and hips, and right leg weakness with limitations with right knee motion and pain of the right knee from prior surgery that is likely contributing to her current low back pain and impairment of her functional status as indicated on FOTO.   OBJECTIVE IMPAIRMENTS: decreased activity tolerance, decreased ROM, decreased strength, postural dysfunction, and pain.   ACTIVITY LIMITATIONS: lifting, bending, sitting, standing, and locomotion level  PARTICIPATION LIMITATIONS: meal prep, cleaning, laundry, driving, community activity, and occupation  PERSONAL FACTORS: Fitness, Past/current experiences, and Time since onset of injury/illness/exacerbation are also affecting patient's functional outcome.   REHAB POTENTIAL: Good  CLINICAL DECISION MAKING: Stable/uncomplicated  EVALUATION COMPLEXITY: Low  GOALS: Goals reviewed with patient? Yes  SHORT TERM GOALS: Target date: 09/28/2023  Patient will be I with initial HEP in  order to progress with therapy. Baseline: HEP provided at eval Goal status: INITIAL  2.  Patient will report low back pain with activity </= 7/10 in order to reduce functional limitations Baseline: 10/10 pain at worst Goal status: INITIAL  3. Patient will demonstrate lumbar AROM grossly WFL in order to improve bending and ability to perform household tasks  Baseline: see limitations above  Goal status: INITIAL  LONG TERM GOALS: Target date: 10/26/2023  Patient will be I with final HEP to maintain progress from PT. Baseline: HEP provided at eval Goal status: INITIAL  2.  Patient will report >/= 58% status on FOTO to indicate improved functional ability. Baseline: 52% functional status Goal status: INITIAL  3.  Patient will demonstrate hip strength >/= 4/5 MMT in order to improve lifting ability and reduce pain with activity.  Baseline: see limitations above Goal status: INITIAL  4.  Patient will be able to maintain 90-90 abdominal hold for 30 seconds in order to indicate improvement in core control and reduce low back pain with activity Baseline: unable to hold Goal status: INITIAL  5. Patient will report low back pain </= 3/10 in order to reduce functional limitations  Baseline: 10/10 pain  Goal status: INITIAL   PLAN: PT FREQUENCY: 1x/week  PT DURATION: 8 weeks  PLANNED INTERVENTIONS: 97164- PT Re-evaluation, 97110-Therapeutic exercises, 97530- Therapeutic activity, 97112- Neuromuscular re-education, 97535- Self Care, 53664- Manual therapy, [phone]- Aquatic Therapy, Patient/Family education, Dry Needling, Joint mobilization, Joint manipulation, Spinal manipulation, Spinal mobilization, Cryotherapy, and Moist heat.  PLAN FOR NEXT SESSION: Review HEP and progress PRN, manual/TPDN for lumbar region, progress core stabilization and hip strengthening, lifting mechanics   Eloy End PT  09/11/23 9:34 AM    PHYSICAL THERAPY DISCHARGE SUMMARY  Visits from Start of Care:  2  Current functional level related to goals / functional outcomes: See above   Remaining deficits: See above   Education / Equipment: HEP   Patient agrees to discharge. Patient goals were not met. Patient is being discharged due to not returning since the last visit.  Rosana Hoes, PT, DPT, LAT, ATC 12/30/23  3:48 PM Phone: [phone] Fax: [phone]

## 2023-09-16 ENCOUNTER — Ambulatory Visit: Payer: Medicaid Other | Admitting: Physical Therapy

## 2023-09-25 ENCOUNTER — Ambulatory Visit: Payer: Medicaid Other
